# Patient Record
Sex: Female | Born: 1995 | Race: Asian | Hispanic: No | Marital: Single | Smoking: Former smoker
Health system: Southern US, Community
[De-identification: ages and names within clinical notes are randomized; demographics above are authoritative.]

## PROBLEM LIST (undated history)

## (undated) ENCOUNTER — Emergency Department (HOSPITAL_COMMUNITY): Admission: EM | Source: Home / Self Care

## (undated) VITALS — BP 90/60 | HR 102 | Temp 98.6°F | Resp 16 | Ht 60.04 in | Wt 101.6 lb

## (undated) DIAGNOSIS — F32A Depression, unspecified: Secondary | ICD-10-CM

## (undated) DIAGNOSIS — J3501 Chronic tonsillitis: Secondary | ICD-10-CM

## (undated) DIAGNOSIS — F419 Anxiety disorder, unspecified: Secondary | ICD-10-CM

## (undated) DIAGNOSIS — F329 Major depressive disorder, single episode, unspecified: Secondary | ICD-10-CM

## (undated) DIAGNOSIS — J45998 Other asthma: Secondary | ICD-10-CM

## (undated) DIAGNOSIS — F603 Borderline personality disorder: Secondary | ICD-10-CM

---
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
## 2016-01-08 ENCOUNTER — Ambulatory Visit (HOSPITAL_COMMUNITY)
Admission: EM | Admit: 2016-01-08 | Discharge: 2016-01-08 | Disposition: A | Discharge status: Home / Self Care | Payer: Medicaid Other | Attending: Family Medicine | Admitting: Family Medicine

## 2016-01-08 ENCOUNTER — Encounter (HOSPITAL_COMMUNITY): Payer: Self-pay | Admitting: Emergency Medicine

## 2016-01-08 DIAGNOSIS — J020 Streptococcal pharyngitis: Secondary | ICD-10-CM | POA: Diagnosis not present

## 2016-01-08 LAB — POCT RAPID STREP A: STREPTOCOCCUS, GROUP A SCREEN (DIRECT): POSITIVE — AB

## 2016-01-08 MED ORDER — AMOXICILLIN 875 MG PO TABS: 875.0000 mg | ORAL_TABLET | Freq: Two times a day (BID) | ORAL | 0 refills | Status: DC

## 2016-01-08 MED ORDER — LIDOCAINE VISCOUS 2 % MT SOLN: 20.0000 mL | OROMUCOSAL | 0 refills | Status: DC | PRN

## 2016-01-08 NOTE — ED Triage Notes (Signed)
Pt reports having a sore throat today.  She has no other concerning symptoms at this time.
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
## 2016-01-20 MED FILL — ARIPiprazole 5 MG TABS: 5 | 30 days supply | Qty: 60 | Fill #1

## 2016-06-03 ENCOUNTER — Ambulatory Visit (HOSPITAL_COMMUNITY)
Admission: EM | Admit: 2016-06-03 | Discharge: 2016-06-03 | Disposition: A | Discharge status: Home / Self Care | Payer: 59 | Attending: Family Medicine | Admitting: Family Medicine

## 2016-06-03 DIAGNOSIS — J020 Streptococcal pharyngitis: Secondary | ICD-10-CM | POA: Diagnosis not present

## 2016-06-03 LAB — POCT RAPID STREP A: STREPTOCOCCUS, GROUP A SCREEN (DIRECT): POSITIVE — AB

## 2016-06-03 MED ORDER — AZITHROMYCIN 250 MG PO TABS: 250.0000 mg | ORAL_TABLET | Freq: Every day | ORAL | 0 refills | Status: DC

## 2016-06-03 MED ORDER — LIDOCAINE VISCOUS 2 % MT SOLN: 20.0000 mL | OROMUCOSAL | 0 refills | Status: DC | PRN

## 2016-06-03 MED FILL — AZITHROMYCIN 250 MG TABLET: 250 | 5 days supply | Qty: 6 | Fill #0

## 2016-06-03 MED FILL — LIDOCAINE 2% VISCOUS SOLN: 2 | 2 days supply | Qty: 100 | Fill #0
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
## 2016-06-03 NOTE — ED Triage Notes (Signed)
Pt has been suffering from a sore throat for 2 days.  Pt denies any other symptoms.  She is not sure if she has had a fever.

## 2016-06-18 ENCOUNTER — Ambulatory Visit (INDEPENDENT_AMBULATORY_CARE_PROVIDER_SITE_OTHER): Payer: 59 | Admitting: Family Medicine

## 2016-06-18 VITALS — BP 100/70 | HR 70 | Temp 98.1°F | Wt 110.8 lb

## 2016-06-18 DIAGNOSIS — S91301A Unspecified open wound, right foot, initial encounter: Secondary | ICD-10-CM

## 2016-06-18 DIAGNOSIS — J0391 Acute recurrent tonsillitis, unspecified: Secondary | ICD-10-CM | POA: Diagnosis not present

## 2016-06-18 NOTE — Patient Instructions (Signed)
Thank you so much for coming to visit today! I suspect your foot should continue to heal, but may take a few weeks. Please return if no improvement over the next 3-4 weeks or if you notice worsening. I have placed a referral to ENT. Please let me know if you do not hear from them soon.  Dr. Caroleen Hammanumley

## 2016-06-20 NOTE — Progress Notes (Signed)
Subjective:     Patient ID: Nancy Barr, female   DOB: 10/12/1995, 21 y.o.   MRN: [id number]  HPI  Nancy Barr is a 21yo female presenting today for cut on right heel. Reports she stepped on a razor while dancing in the shower on January 19 and cut her right heel. Wound was initially gaping and bled significantly. Did not go to ED for sutures. Wound has slowly been healing, but is not quite heeled yet and still slightly tender, so she came for evaluation. Denies redness or warmth. Able to bear weight. Still keeping area clean and covering.  Also of note, reports recurrent history of tonsillitis. Notes four episodes in last year with most recent episode in January 2018. Has considered surgery and would like referral to ENT to initiate this discussion. Reports she always tests positive for Strep during episodes. Nonsmoker  Review of Systems Per HPI    Objective:   Physical Exam  Constitutional: She appears well-developed and well-nourished. No distress.  HENT:  Head: Normocephalic and atraumatic.  Mouth/Throat: No oropharyngeal exudate.  Cardiovascular: Normal rate and regular rhythm.   No murmur heard. Pulmonary/Chest: Effort normal. No respiratory distress. She has no wheezes.  Skin:  Healing wound on right heel, currently closed with dry skin overtop of healed lesion. Ecchymosis noted around lesion. No signs of redness or warmth       Assessment and Plan:     1. Unspecified open wound, right foot, initial encounter Healing well. Discussed that wound should continue to heel. If no improvement over the next 3-4 weeks or if she notices worsening, to return.  2. Recurrent tonsillitis Referral to ENT to discuss if surgery is appropriate.

## 2016-06-23 ENCOUNTER — Telehealth: Payer: Self-pay | Admitting: General Practice

## 2016-06-23 NOTE — Telephone Encounter (Signed)
Patient will need to be seen in clinic for retesting and if antibiotics are needed at that time. Patient was not seen in this clinic when diagnosis with strep.  Patient was last seen in a couple of weeks ago at an Urgent Care.  Clovis PuMartin, Tamika L, RN

## 2016-06-23 NOTE — Telephone Encounter (Signed)
Mom stated pt had strep a few weeks ago and now has it again. [facility] Mom declined appt for tomorrow. ep

## 2016-06-23 NOTE — Telephone Encounter (Signed)
Mom was advised. Taking pt to Urgent Care. ep

## 2016-07-21 DIAGNOSIS — J3501 Chronic tonsillitis: Secondary | ICD-10-CM | POA: Diagnosis not present

## 2016-07-29 ENCOUNTER — Ambulatory Visit (INDEPENDENT_AMBULATORY_CARE_PROVIDER_SITE_OTHER): Payer: 59 | Admitting: Psychiatry

## 2016-07-29 ENCOUNTER — Encounter (HOSPITAL_COMMUNITY): Payer: Self-pay | Admitting: Psychiatry

## 2016-07-29 VITALS — BP 98/60 | HR 98 | Ht 60.0 in | Wt 109.0 lb

## 2016-07-29 DIAGNOSIS — F39 Unspecified mood [affective] disorder: Secondary | ICD-10-CM | POA: Diagnosis not present

## 2016-07-29 DIAGNOSIS — F902 Attention-deficit hyperactivity disorder, combined type: Secondary | ICD-10-CM

## 2016-07-29 DIAGNOSIS — F411 Generalized anxiety disorder: Secondary | ICD-10-CM

## 2016-07-29 DIAGNOSIS — Z882 Allergy status to sulfonamides status: Secondary | ICD-10-CM

## 2016-07-29 DIAGNOSIS — Z91040 Latex allergy status: Secondary | ICD-10-CM

## 2016-07-29 DIAGNOSIS — F1290 Cannabis use, unspecified, uncomplicated: Secondary | ICD-10-CM | POA: Diagnosis not present

## 2016-07-29 DIAGNOSIS — Z813 Family history of other psychoactive substance abuse and dependence: Secondary | ICD-10-CM

## 2016-07-29 DIAGNOSIS — F17210 Nicotine dependence, cigarettes, uncomplicated: Secondary | ICD-10-CM

## 2016-07-29 NOTE — Progress Notes (Signed)
BH MD/PA/NP OP Progress Note  07/29/2016 4:48 PM SHILEE BIGGS  MRN:  [id number]  Chief Complaint:  Chief Complaint    Follow-up; Mood Disorder; ADHD; Anxiety     Subjective:  I'm doing okay, I'm not been taking my medication for months, I don't need them HPI: Patient is a 21 year old female diagnosed with mood disorder, generalized anxiety disorder, ADHD combined type and borderline personality disorder who presents for follow-up appointment  Patient states that she does not believe in taking psychotropic medication, as that she feels she does not need it. Patient reports that she's not taken any medications for 6-7 months now, adds that she is doing well, is living in an apartment near [facility]. Mom however disagrees and reports that patient continues to have affective instability, gets into arguments, struggles with anger, gets anxious at times along with being overwhelmed. Patient states that she disagrees, does not feel she needs to see a therapist or psychiatrist.  Discussed various options with patient including getting a second opinion from an adult psychiatrist and patient states that she will think about this. Also discussed patient seeing Victorino Dike therapy here at the clinic and patient adds that she's not sure about this.  Patient denies any symptoms of mania, depression, anxiety but does acknowledge mood irritability, poor frustration tolerance, impulse control issues but adds that she's not abusing substances, has friends, is starting orientation for a new job. Mom reports that patient is unable to keep a job because of her mood disorder and patient states that she plans to keep her new job. Patient denies any symptoms of psychosis, any thoughts of hurting herself or others. She also denies any self mutilating behaviors Visit Diagnosis:    ICD-9-CM ICD-10-CM   1. Attention deficit hyperactivity disorder (ADHD), combined type 314.01 F90.2   2. Episodic mood disorder (HCC) 296.90 F39    3. GAD (generalized anxiety disorder) 300.02 F41.1     Past Psychiatric History: Patient has an extensive psychiatric history, was hospitalized twice as an adolescent and then once as an adult. Patient has had multiple visits to the ER, has had episodic mood disorder along with aggression, generalized anxiety disorder and ADHD combined type. Patient has been on psychotropic medications for many years now but has been noncompliant with medications since her last visit.  Past Medical History:  Past Medical History:  Diagnosis Date  . Anxiety   . Asthma   . Borderline personality disorder   . Depression   . Kidney infection     Past Surgical History:  Procedure Laterality Date  . broken arm     2nd grade    Family Psychiatric History: Patient is adopted  Family History:  Family History  Problem Relation Age of Onset  . Drug abuse Brother     Social History:  Social History   Social History  . Marital status: Single    Spouse name: N/A  . Number of children: N/A  . Years of education: N/A   Social History Main Topics  . Smoking status: Current Every Day Smoker    Years: 2.00    Types: E-cigarettes    Start date: 04/09/2013  . Smokeless tobacco: Never Used     Comment: 2nd hand smoke from  brother use of tobacco; says smoke monthly/weekly  . Alcohol use 10.8 oz/week    8 Cans of beer, 10 Shots of liquor per week     Comment: Binges on weekends  . Drug use: Yes  Types: Marijuana     Comment: 1-2 times a month  . Sexual activity: No     Comment: On Nuvaring   Other Topics Concern  . None   Social History Narrative  . None    Allergies:  Allergies  Allergen Reactions  . Latex Rash  . Sulfa Antibiotics Rash    Metabolic Disorder Labs: No results found for: HGBA1C, MPG Lab Results  Component Value Date   PROLACTIN 16.0 01/17/2013   No results found for: CHOL, TRIG, HDL, CHOLHDL, VLDL, LDLCALC   Current Medications: No current outpatient  prescriptions on file.   No current facility-administered medications for this visit.     Neurologic: Headache: No Seizure: No Paresthesias: No  Musculoskeletal: Strength & Muscle Tone: within normal limits Gait & Station: normal Patient leans: N/A  Psychiatric Specialty Exam: Review of Systems  Constitutional: Negative.  Negative for fever and malaise/fatigue.  HENT: Negative.  Negative for congestion and sore throat.   Eyes: Negative.  Negative for blurred vision, discharge and redness.  Respiratory: Negative.  Negative for cough, shortness of breath and wheezing.   Cardiovascular: Negative.  Negative for chest pain.  Gastrointestinal: Negative.  Negative for abdominal pain, heartburn, nausea and vomiting.  Genitourinary: Negative.  Negative for dysuria and urgency.  Musculoskeletal: Negative.  Negative for falls and myalgias.  Skin: Negative.  Negative for itching and rash.  Neurological: Negative.  Negative for dizziness, seizures, loss of consciousness and weakness.  Endo/Heme/Allergies: Negative.  Negative for environmental allergies.  Psychiatric/Behavioral: Negative for depression, hallucinations, substance abuse and suicidal ideas. The patient is not nervous/anxious and does not have insomnia.        Positive for mood irritability    Blood pressure 98/60, pulse 98, height 5' (1.524 m), weight 109 lb (49.4 kg), SpO2 99 %.Body mass index is 21.29 kg/m.  General Appearance: Casual  Eye Contact:  Fair  Speech:  Clear and Coherent and Normal Rate  Volume:  Increased  Mood:  Angry and Irritable  Affect:  Congruent and Labile  Thought Process:  Coherent, Goal Directed and Descriptions of Associations: Intact  Orientation:  Full (Time, Place, and Person)  Thought Content: Logical   Suicidal Thoughts:  No  Homicidal Thoughts:  No  Memory:  Immediate;   Fair Recent;   Fair Remote;   Fair  Judgement:  Impaired  Insight:  Lacking  Psychomotor Activity:  Normal   Concentration:  Concentration: Fair and Attention Span: Fair  Recall:  [employer] of Knowledge: Fair  Language: Fair  Akathisia:  No  Handed:  Right  AIMS (if indicated):  N/A  Assets:  Housing Physical Health Social Support  ADL's:  Intact  Cognition: WNL  Sleep:  good     Treatment Plan Summary:Medication management  Patient refuses to take any medications, states that she can manage without medications but continues to have affective instability and would benefit from a mood stabilizer. Discussed in length with patient the need to see a therapist, work on her coping skills, her communication skills and her relationship issues. Also discussed the need to improve communication with parents, work on looking for a job and working regularly. Patient states that she's gotten a job at [employer] and will start the orientation on Monday. Mom reports that patient has had multiple jobs in the past, just walks off and continues to struggle with her mood, impulse control and anxiety Also discussed seeing an adult psychiatrist to help get a second opinion as patient continues  to struggle with his psychiatric diagnosis, her need for medications in order to be stable   Nelly RoutKUMAR,Jakiah Bienaime, MD 07/29/2016, 4:48 PM

## 2016-09-11 ENCOUNTER — Encounter (HOSPITAL_COMMUNITY): Payer: Self-pay | Admitting: Emergency Medicine

## 2016-09-11 ENCOUNTER — Ambulatory Visit (HOSPITAL_COMMUNITY)
Admission: EM | Admit: 2016-09-11 | Discharge: 2016-09-11 | Disposition: A | Discharge status: Home / Self Care | Payer: 59 | Attending: Internal Medicine | Admitting: Internal Medicine

## 2016-09-11 DIAGNOSIS — F603 Borderline personality disorder: Secondary | ICD-10-CM | POA: Insufficient documentation

## 2016-09-11 DIAGNOSIS — F419 Anxiety disorder, unspecified: Secondary | ICD-10-CM | POA: Diagnosis not present

## 2016-09-11 DIAGNOSIS — J45909 Unspecified asthma, uncomplicated: Secondary | ICD-10-CM | POA: Diagnosis not present

## 2016-09-11 DIAGNOSIS — F329 Major depressive disorder, single episode, unspecified: Secondary | ICD-10-CM | POA: Insufficient documentation

## 2016-09-11 DIAGNOSIS — Z888 Allergy status to other drugs, medicaments and biological substances status: Secondary | ICD-10-CM | POA: Diagnosis not present

## 2016-09-11 DIAGNOSIS — F17210 Nicotine dependence, cigarettes, uncomplicated: Secondary | ICD-10-CM | POA: Insufficient documentation

## 2016-09-11 DIAGNOSIS — R51 Headache: Secondary | ICD-10-CM | POA: Diagnosis not present

## 2016-09-11 DIAGNOSIS — J029 Acute pharyngitis, unspecified: Secondary | ICD-10-CM | POA: Diagnosis not present

## 2016-09-11 DIAGNOSIS — Z9889 Other specified postprocedural states: Secondary | ICD-10-CM | POA: Insufficient documentation

## 2016-09-11 DIAGNOSIS — J3089 Other allergic rhinitis: Secondary | ICD-10-CM | POA: Diagnosis not present

## 2016-09-11 DIAGNOSIS — Z8781 Personal history of (healed) traumatic fracture: Secondary | ICD-10-CM | POA: Diagnosis not present

## 2016-09-11 LAB — POCT RAPID STREP A: Streptococcus, Group A Screen (Direct): NEGATIVE

## 2016-09-11 MED ORDER — DEXAMETHASONE SODIUM PHOSPHATE 10 MG/ML IJ SOLN
INTRAMUSCULAR | Status: AC
  Filled 2016-09-11: qty 1

## 2016-09-11 MED ORDER — DEXAMETHASONE SODIUM PHOSPHATE 10 MG/ML IJ SOLN: 10.0000 mg | Freq: Once | INTRAMUSCULAR | Status: DC

## 2016-09-11 NOTE — ED Provider Notes (Signed)
CSN: [id number]     Arrival date & time 09/11/16  1408 History   First MD Initiated Contact with Patient 09/11/16 1525     Chief Complaint  Patient presents with  . Headache   (Consider location/radiation/quality/duration/timing/severity/associated sxs/prior Treatment) 21 y.o. female presents with headache to parietal lobes X 3-4 days and sore throat. Condition is acute in nature. Condition is made better by nothing. Condition is worse at nihjt. Patient denies any treatment prior to there arrival at this facility. Patient has a history of recurrent tonsillitis and states that she is suppose to get her tonsils removed but has not scheduled it yet due to her work schedule.       Past Medical History:  Diagnosis Date  . Anxiety   . Asthma   . Borderline personality disorder   . Depression   . Kidney infection    Past Surgical History:  Procedure Laterality Date  . broken arm     2nd grade   Family History  Problem Relation Age of Onset  . Drug abuse Brother    Social History  Substance Use Topics  . Smoking status: Current Every Day Smoker    Years: 2.00    Types: E-cigarettes    Start date: 04/09/2013  . Smokeless tobacco: Never Used     Comment: 2nd hand smoke from  brother use of tobacco; says smoke monthly/weekly  . Alcohol use 10.8 oz/week    8 Cans of beer, 10 Shots of liquor per week     Comment: Binges on weekends   OB History    No data available     Review of Systems  Allergies  Latex and Sulfa antibiotics  Home Medications   Prior to Admission medications   Not on File   Meds Ordered and Administered this Visit   Medications  dexamethasone (DECADRON) injection 10 mg (not administered)    Patient refused medication   BP 105/66 (BP Location: Right Arm)   Pulse (!) 101   Temp 98.4 F (36.9 C) (Oral)   Resp 20   LMP 09/04/2016   SpO2 99%  No data found.   Physical Exam  Urgent Care Course     Procedures (including critical care  time)  Labs Review Labs Reviewed - No data to display  Imaging Review No results found.        MDM  No diagnosis found.     Alene Miresmohundro, Angeliz Settlemyre C, NP 09/11/16 [phone]

## 2016-09-11 NOTE — Discharge Instructions (Signed)
Follow up with ENT regarding recurrent tonsillitis. Continue to push fluids and take over the counter medications as directed on the back of the box for symptomatic relief. Flonase may help daily symptoms

## 2016-09-11 NOTE — ED Triage Notes (Signed)
Here for constant HA onset "45 hours" associated w/swollen tonsils, PND, chills  Denies fevers A&O x4... NAD

## 2016-09-14 LAB — CULTURE, GROUP A STREP (THRC)

## 2016-10-08 ENCOUNTER — Encounter (HOSPITAL_BASED_OUTPATIENT_CLINIC_OR_DEPARTMENT_OTHER): Payer: Self-pay | Admitting: *Deleted

## 2016-10-08 DIAGNOSIS — J3501 Chronic tonsillitis: Secondary | ICD-10-CM

## 2016-10-08 HISTORY — DX: Chronic tonsillitis: J35.01

## 2016-10-11 DIAGNOSIS — Z114 Encounter for screening for human immunodeficiency virus [HIV]: Secondary | ICD-10-CM | POA: Diagnosis not present

## 2016-10-11 DIAGNOSIS — Z113 Encounter for screening for infections with a predominantly sexual mode of transmission: Secondary | ICD-10-CM | POA: Diagnosis not present

## 2016-10-11 DIAGNOSIS — Z118 Encounter for screening for other infectious and parasitic diseases: Secondary | ICD-10-CM | POA: Diagnosis not present

## 2016-10-11 DIAGNOSIS — Z1159 Encounter for screening for other viral diseases: Secondary | ICD-10-CM | POA: Diagnosis not present

## 2016-10-12 ENCOUNTER — Other Ambulatory Visit: Payer: Self-pay | Admitting: Otolaryngology

## 2016-10-14 MED FILL — AZITHROMYCIN 500 MG TABLET: 500 | 1 days supply | Qty: 2 | Fill #0

## 2016-10-15 ENCOUNTER — Ambulatory Visit (HOSPITAL_BASED_OUTPATIENT_CLINIC_OR_DEPARTMENT_OTHER)
Admission: RE | Admit: 2016-10-15 | Discharge: 2016-10-15 | Disposition: A | Discharge status: Home / Self Care | Payer: 59 | Source: Ambulatory Visit | Attending: Otolaryngology | Admitting: Otolaryngology

## 2016-10-15 ENCOUNTER — Encounter (HOSPITAL_BASED_OUTPATIENT_CLINIC_OR_DEPARTMENT_OTHER)
Admission: RE | Disposition: A | Discharge status: Home / Self Care | Payer: Self-pay | Source: Ambulatory Visit | Attending: Otolaryngology

## 2016-10-15 HISTORY — DX: Other asthma: J45.998

## 2016-10-15 HISTORY — DX: Chronic tonsillitis: J35.01

## 2016-10-15 SURGERY — TONSILLECTOMY
Anesthesia: General | Laterality: Bilateral

## 2016-10-21 ENCOUNTER — Ambulatory Visit (INDEPENDENT_AMBULATORY_CARE_PROVIDER_SITE_OTHER): Payer: 59 | Admitting: Psychiatry

## 2016-10-21 DIAGNOSIS — Z882 Allergy status to sulfonamides status: Secondary | ICD-10-CM | POA: Diagnosis not present

## 2016-10-21 DIAGNOSIS — F17290 Nicotine dependence, other tobacco product, uncomplicated: Secondary | ICD-10-CM

## 2016-10-21 DIAGNOSIS — F603 Borderline personality disorder: Secondary | ICD-10-CM

## 2016-10-21 DIAGNOSIS — Z91040 Latex allergy status: Secondary | ICD-10-CM

## 2016-10-21 DIAGNOSIS — Z915 Personal history of self-harm: Secondary | ICD-10-CM | POA: Diagnosis not present

## 2016-10-21 DIAGNOSIS — F331 Major depressive disorder, recurrent, moderate: Secondary | ICD-10-CM | POA: Diagnosis not present

## 2016-10-21 DIAGNOSIS — F1310 Sedative, hypnotic or anxiolytic abuse, uncomplicated: Secondary | ICD-10-CM | POA: Diagnosis not present

## 2016-10-21 DIAGNOSIS — F1390 Sedative, hypnotic, or anxiolytic use, unspecified, uncomplicated: Secondary | ICD-10-CM

## 2016-10-21 DIAGNOSIS — F39 Unspecified mood [affective] disorder: Secondary | ICD-10-CM | POA: Diagnosis not present

## 2016-10-21 NOTE — Patient Instructions (Signed)
Escitalopram tablets What is this medicine? ESCITALOPRAM (es sye TAL oh pram) is used to treat depression and certain types of anxiety. This medicine may be used for other purposes; ask your health care provider or pharmacist if you have questions. COMMON BRAND NAME(S): Lexapro What should I tell my health care provider before I take this medicine? They need to know if you have any of these conditions: -bipolar disorder or a family history of bipolar disorder -diabetes -glaucoma -heart disease -kidney or liver disease -receiving electroconvulsive therapy -seizures (convulsions) -suicidal thoughts, plans, or attempt by you or a family member -an unusual or allergic reaction to escitalopram, the related drug citalopram, other medicines, foods, dyes, or preservatives -pregnant or trying to become pregnant -breast-feeding How should I use this medicine? Take this medicine by mouth with a glass of water. Follow the directions on the prescription label. You can take it with or without food. If it upsets your stomach, take it with food. Take your medicine at regular intervals. Do not take it more often than directed. Do not stop taking this medicine suddenly except upon the advice of your doctor. Stopping this medicine too quickly may cause serious side effects or your condition may worsen. A special MedGuide will be given to you by the pharmacist with each prescription and refill. Be sure to read this information carefully each time. Talk to your pediatrician regarding the use of this medicine in children. Special care may be needed. Overdosage: If you think you have taken too much of this medicine contact a poison control center or emergency room at once. NOTE: This medicine is only for you. Do not share this medicine with others. What if I miss a dose? If you miss a dose, take it as soon as you can. If it is almost time for your next dose, take only that dose. Do not take double or extra  doses. What may interact with this medicine? Do not take this medicine with any of the following medications: -certain medicines for fungal infections like fluconazole, itraconazole, ketoconazole, posaconazole, voriconazole -cisapride -citalopram -dofetilide -dronedarone -linezolid -MAOIs like Carbex, Eldepryl, Marplan, Nardil, and Parnate -methylene blue (injected into a vein) -pimozide -thioridazine -ziprasidone This medicine may also interact with the following medications: -alcohol -amphetamines -aspirin and aspirin-like medicines -carbamazepine -certain medicines for depression, anxiety, or psychotic disturbances -certain medicines for migraine headache like almotriptan, eletriptan, frovatriptan, naratriptan, rizatriptan, sumatriptan, zolmitriptan -certain medicines for sleep -certain medicines that treat or prevent blood clots like warfarin, enoxaparin, dalteparin -cimetidine -diuretics -fentanyl -furazolidone -isoniazid -lithium -metoprolol -NSAIDs, medicines for pain and inflammation, like ibuprofen or naproxen -other medicines that prolong the QT interval (cause an abnormal heart rhythm) -procarbazine -rasagiline -supplements like St. John's wort, kava kava, valerian -tramadol -tryptophan This list may not describe all possible interactions. Give your health care provider a list of all the medicines, herbs, non-prescription drugs, or dietary supplements you use. Also tell them if you smoke, drink alcohol, or use illegal drugs. Some items may interact with your medicine. What should I watch for while using this medicine? Tell your doctor if your symptoms do not get better or if they get worse. Visit your doctor or health care professional for regular checks on your progress. Because it may take several weeks to see the full effects of this medicine, it is important to continue your treatment as prescribed by your doctor. Patients and their families should watch out for  new or worsening thoughts of suicide or depression. Also watch out for   sudden changes in feelings such as feeling anxious, agitated, panicky, irritable, hostile, aggressive, impulsive, severely restless, overly excited and hyperactive, or not being able to sleep. If this happens, especially at the beginning of treatment or after a change in dose, call your health care professional. Dennis Bast may get drowsy or dizzy. Do not drive, use machinery, or do anything that needs mental alertness until you know how this medicine affects you. Do not stand or sit up quickly, especially if you are an older patient. This reduces the risk of dizzy or fainting spells. Alcohol may interfere with the effect of this medicine. Avoid alcoholic drinks. Your mouth may get dry. Chewing sugarless gum or sucking hard candy, and drinking plenty of water may help. Contact your doctor if the problem does not go away or is severe. What side effects may I notice from receiving this medicine? Side effects that you should report to your doctor or health care professional as soon as possible: -allergic reactions like skin rash, itching or hives, swelling of the face, lips, or tongue -anxious -black, tarry stools -changes in vision -confusion -elevated mood, decreased need for sleep, racing thoughts, impulsive behavior -eye pain -fast, irregular heartbeat -feeling faint or lightheaded, falls -feeling agitated, angry, or irritable -hallucination, loss of contact with reality -loss of balance or coordination -loss of memory -painful or prolonged erections -restlessness, pacing, inability to keep still -seizures -stiff muscles -suicidal thoughts or other mood changes -trouble sleeping -unusual bleeding or bruising -unusually weak or tired -vomiting Side effects that usually do not require medical attention (report to your doctor or health care professional if they continue or are bothersome): -changes in appetite -change in sex  drive or performance -headache -increased sweating -indigestion, nausea -tremors This list may not describe all possible side effects. Call your doctor for medical advice about side effects. You may report side effects to FDA at 1-800-FDA-1088. Where should I keep my medicine? Keep out of reach of children. Store at room temperature between 15 and 30 degrees C (59 and 86 degrees F). Throw away any unused medicine after the expiration date. NOTE: This sheet is a summary. It may not cover all possible information. If you have questions about this medicine, talk to your doctor, pharmacist, or health care provider.  2018 Elsevier/Gold Standard (2015-09-29 13:20:23) Buspirone tablets What is this medicine? BUSPIRONE (byoo SPYE rone) is used to treat anxiety disorders. This medicine may be used for other purposes; ask your health care provider or pharmacist if you have questions. COMMON BRAND NAME(S): BuSpar What should I tell my health care provider before I take this medicine? They need to know if you have any of these conditions: -kidney or liver disease -an unusual or allergic reaction to buspirone, other medicines, foods, dyes, or preservatives -pregnant or trying to get pregnant -breast-feeding How should I use this medicine? Take this medicine by mouth with a glass of water. Follow the directions on the prescription label. You may take this medicine with or without food. To ensure that this medicine always works the same way for you, you should take it either always with or always without food. Take your doses at regular intervals. Do not take your medicine more often than directed. Do not stop taking except on the advice of your doctor or health care professional. Talk to your pediatrician regarding the use of this medicine in children. Special care may be needed. Overdosage: If you think you have taken too much of this medicine contact a poison control  center or emergency room at  once. NOTE: This medicine is only for you. Do not share this medicine with others. What if I miss a dose? If you miss a dose, take it as soon as you can. If it is almost time for your next dose, take only that dose. Do not take double or extra doses. What may interact with this medicine? Do not take this medicine with any of the following medications: -linezolid -MAOIs like Carbex, Eldepryl, Marplan, Nardil, and Parnate -methylene blue -procarbazine This medicine may also interact with the following medications: -diazepam -digoxin -diltiazem -erythromycin -grapefruit juice -haloperidol -medicines for mental depression or mood problems -medicines for seizures like carbamazepine, phenobarbital and phenytoin -nefazodone -other medications for anxiety -rifampin -ritonavir -some antifungal medicines like itraconazole, ketoconazole, and voriconazole -verapamil -warfarin This list may not describe all possible interactions. Give your health care provider a list of all the medicines, herbs, non-prescription drugs, or dietary supplements you use. Also tell them if you smoke, drink alcohol, or use illegal drugs. Some items may interact with your medicine. What should I watch for while using this medicine? Visit your doctor or health care professional for regular checks on your progress. It may take 1 to 2 weeks before your anxiety gets better. You may get drowsy or dizzy. Do not drive, use machinery, or do anything that needs mental alertness until you know how this drug affects you. Do not stand or sit up quickly, especially if you are an older patient. This reduces the risk of dizzy or fainting spells. Alcohol can make you more drowsy and dizzy. Avoid alcoholic drinks. What side effects may I notice from receiving this medicine? Side effects that you should report to your doctor or health care professional as soon as possible: -blurred vision or other vision changes -chest  pain -confusion -difficulty breathing -feelings of hostility or anger -muscle aches and pains -numbness or tingling in hands or feet -ringing in the ears -skin rash and itching -vomiting -weakness Side effects that usually do not require medical attention (report to your doctor or health care professional if they continue or are bothersome): -disturbed dreams, nightmares -headache -nausea -restlessness or nervousness -sore throat and nasal congestion -stomach upset This list may not describe all possible side effects. Call your doctor for medical advice about side effects. You may report side effects to FDA at 1-800-FDA-1088. Where should I keep my medicine? Keep out of the reach of children. Store at room temperature below 30 degrees C (86 degrees F). Protect from light. Keep container tightly closed. Throw away any unused medicine after the expiration date. NOTE: This sheet is a summary. It may not cover all possible information. If you have questions about this medicine, talk to your doctor, pharmacist, or health care provider.  2018 Elsevier/Gold Standard (2009-12-04 18:06:11)
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
## 2016-11-10 ENCOUNTER — Ambulatory Visit (HOSPITAL_COMMUNITY)
Admission: EM | Admit: 2016-11-10 | Discharge: 2016-11-10 | Disposition: A | Discharge status: Home / Self Care | Payer: 59 | Attending: Family Medicine | Admitting: Family Medicine

## 2016-11-10 ENCOUNTER — Ambulatory Visit (INDEPENDENT_AMBULATORY_CARE_PROVIDER_SITE_OTHER): Payer: 59

## 2016-11-10 ENCOUNTER — Encounter (HOSPITAL_COMMUNITY): Payer: Self-pay | Admitting: Emergency Medicine

## 2016-11-10 DIAGNOSIS — M79641 Pain in right hand: Secondary | ICD-10-CM | POA: Diagnosis not present

## 2016-11-10 DIAGNOSIS — S6991XA Unspecified injury of right wrist, hand and finger(s), initial encounter: Secondary | ICD-10-CM | POA: Diagnosis not present

## 2016-11-10 NOTE — ED Triage Notes (Signed)
[facility] with a complaint of right hand pain that started last night after punching a wall.

## 2016-11-10 NOTE — ED Provider Notes (Signed)
  [facility]   [id number] 11/10/16 Arrival Time: 1154  ASSESSMENT & PLAN:  Today you were diagnosed with the following: 1. Hand injury, right, initial encounter    No broken bones were seen on your x-rays today. You may take ibuprofen as needed for the next few days for help with discomfort.  You have not been prescribed prescription medications this visit.   If you are not improving over the next few days or feel you are worsening please follow up here or the Emergency Department if you are unable to see your regular doctor.  Reviewed expectations re: course of current medical issues. Questions answered. Outlined signs and symptoms indicating need for more acute intervention. Patient verbalized understanding. After Visit Summary given.   SUBJECTIVE:  Nancy Barr is a 21 y.o. female who presents with complaint of R hand pain. Intoxicated last evening and "decided to punch a wall". Hand pain since. Noticed bruising today. Mild swelling. No sensation changes. No self-treatment.  ROS: As per HPI.   OBJECTIVE:  Vitals:   11/10/16 1242  BP: 117/68  Pulse: 65  Resp: 16  Temp: 98.5 F (36.9 C)  TempSrc: Oral  SpO2: 100%     General appearance: alert;  no distress Extremities: R hand with bruising over dorsal 4/5 metacarpal distribution; proximal 5th metacarpal tenderness; R wrist with FROM MSK: symmetrical with no gross deformities Skin: warm and dry Neurologic: RUE with intact sensation  Imaging: Dg Hand Complete Right  Result Date: 11/10/2016 CLINICAL DATA:  Per pt: side punched a tile wall last night and injured the right hand. Patient hit the wall with the medial side of the right hand fist. Pain is the right hand, 4th AND 5th metacarpals. No prior injury to the right hand. Patient is not a diabetic EXAM: RIGHT HAND - COMPLETE 3+ VIEW COMPARISON:  None. FINDINGS: There is no evidence of fracture or dislocation. There is no evidence of arthropathy or other  focal bone abnormality. Soft tissues are unremarkable. IMPRESSION: Negative. Electronically Signed   By: Norva PavlovElizabeth  Brown M.D.   On: 11/10/2016 13:43    Allergies  Allergen Reactions  . Latex Rash  . Sulfa Antibiotics Rash    PMHx, SurgHx, SocialHx, Medications, and Allergies were reviewed in the Visit Navigator and updated as appropriate.       Mardella LaymanHagler, Deni Lefever, MD 11/10/16 [phone]

## 2016-11-10 NOTE — Discharge Instructions (Signed)
Today you were diagnosed with the following: 1. Hand injury, right, initial encounter    No broken bones were seen on your x-rays today. You may take ibuprofen as needed for the next few days for help with discomfort.  You have not been prescribed prescription medications this visit.   If you are not improving over the next few days or feel you are worsening please follow up here or the Emergency Department if you are unable to see your regular doctor.

## 2017-01-25 ENCOUNTER — Ambulatory Visit (HOSPITAL_COMMUNITY)
Admission: EM | Admit: 2017-01-25 | Discharge: 2017-01-25 | Disposition: A | Discharge status: Home / Self Care | Payer: BC Managed Care – PPO | Attending: Family Medicine | Admitting: Family Medicine

## 2017-01-25 ENCOUNTER — Encounter (HOSPITAL_COMMUNITY): Payer: Self-pay | Admitting: *Deleted

## 2017-01-25 DIAGNOSIS — R509 Fever, unspecified: Secondary | ICD-10-CM

## 2017-01-25 DIAGNOSIS — R05 Cough: Secondary | ICD-10-CM

## 2017-01-25 DIAGNOSIS — J029 Acute pharyngitis, unspecified: Secondary | ICD-10-CM | POA: Diagnosis not present

## 2017-01-25 NOTE — ED Triage Notes (Signed)
Patient reports sore throat x 3 days. Unsure of fevers.

## 2017-01-25 NOTE — Discharge Instructions (Signed)
It was nice meeting you today Nancy Barr!  For your sore throat, you can try Cepachol throat lozenges or Chloraseptic throat spray. Both of these have a numbing medication in them that will make your throat feel better. You can also take ibuprofen or Tylenol to help with the pain.   Please schedule a follow up appointment with your ENT to discuss having your tonsils removed.   If you have any questions or concerns, please feel free to call the clinic.   Be well,  Dr. Natale Milch

## 2017-01-25 NOTE — ED Provider Notes (Signed)
[facility]    CSN: [id number] Arrival date & time: 01/25/17  1422  History   Chief Complaint Chief Complaint  Patient presents with  . Sore Throat    HPI Nancy Barr is a 21 y.o. female.   HPI   Patient presents with sore throat. Started four days ago. Severity fluctuates throughout the day. Has not tried anything to help with pain. Endorses accompanying cough and HA. Denies congestion. Thinks may have had a fever on the first day of symptoms but none since. Has been eating and drinking normally. Denies nausea, vomiting, diarrhea. Says that she has seen an ENT before and has been told that she needs a tonsillectomy however did not attend follow-up appointments to schedule to the procedure due to work conflicts.    Past Medical History:  Diagnosis Date  . Anxiety   . Borderline personality disorder   . Chronic tonsillitis 10/2016  . Depression   . Seasonal asthma    no current med.    Patient Active Problem List   Diagnosis Date Noted  . Suicide attempt by drug ingestion (HCC)   . Attention deficit hyperactivity disorder (ADHD), combined type, severe 04/23/2014  . MDD (major depressive disorder), recurrent episode, moderate (HCC) 09/08/2011  . Generalized anxiety disorder 03/24/2011    Past Surgical History:  Procedure Laterality Date  . HYMENECTOMY  03/09/2010   with drainage of hematocolpos    OB History    No data available       Home Medications    Prior to Admission medications   Not on File    Family History Family History  Problem Relation Age of Onset  . Adopted: Yes    Social History Social History  Substance Use Topics  . Smoking status: Current Every Day Smoker    Years: 2.00    Types: E-cigarettes    Start date: 04/09/2013  . Smokeless tobacco: Never Used  . Alcohol use 0.0 oz/week     Comment: 1-2 x/week     Allergies   Latex and Sulfa antibiotics   Review of Systems Review of Systems  Constitutional: Positive  for fever (Subjective).  HENT: Positive for sore throat. Negative for congestion and trouble swallowing.   Eyes: Negative for redness and itching.  Respiratory: Positive for cough.   Gastrointestinal: Negative for constipation, diarrhea, nausea and vomiting.    Physical Exam Triage Vital Signs ED Triage Vitals  Enc Vitals Group     BP      Pulse      Resp      Temp      Temp src      SpO2      Weight      Height      Head Circumference      Peak Flow      Pain Score      Pain Loc      Pain Edu?      Excl. in GC?    No data found.   Updated Vital Signs BP 109/65 (BP Location: Left Arm)   Pulse 77   Temp 98.2 F (36.8 C)   Resp 16   SpO2 100%   Physical Exam  Constitutional: She is oriented to person, place, and time. She appears well-developed and well-nourished. No distress.  HENT:  Head: Normocephalic and atraumatic.  Nose: Nose normal.  Moderate tonsillar enlargement bilaterally. Mild oropharyngeal erythema. No exudates. Uvula midline. MMM.   Eyes: Pupils are equal, round, and  reactive to light. Conjunctivae and EOM are normal. Right eye exhibits no discharge. Left eye exhibits no discharge.  Neck: Normal range of motion. Neck supple.  Cardiovascular: Normal rate, regular rhythm and normal heart sounds.   No murmur heard. Pulmonary/Chest: Effort normal and breath sounds normal. No respiratory distress. She has no wheezes.  Abdominal: Soft. Bowel sounds are normal. She exhibits no distension. There is no tenderness.  Lymphadenopathy:    She has cervical adenopathy (Mild).  Neurological: She is alert and oriented to person, place, and time.  Skin: Skin is warm and dry.  Psychiatric: Her behavior is normal.  Flat affect     UC Treatments / Results  Labs (all labs ordered are listed, but only abnormal results are displayed) Labs Reviewed - No data to display  EKG  EKG Interpretation None       Radiology No results found.  Procedures Procedures  (including critical care time)  Medications Ordered in UC Medications - No data to display   Initial Impression / Assessment and Plan / UC Course  I have reviewed the triage vital signs and the nursing notes.  Pertinent labs & imaging results that were available during my care of the patient were reviewed by me and considered in my medical decision making (see chart for details).     Patient presenting with sore throat x4d, likely viral pharyngitis. Centor score 1 (mild cervical lymphadenopathy) making strep pharyngitis less likely. Afebrile, well-appearing on exam. No issues swallowing and well-hydrated on exam. No signs of peritonsillar abscess. Normal respiratory status on RA without stridor or wheezing. Has already been evaluated by ENT for recurrent sore throats and informed that she needs a tonsillectomy. Discussed symptomatic management and encouraged patient to schedule f/u appt with her ENT regarding tonsillectomy.   Final Clinical Impressions(s) / UC Diagnoses   Final diagnoses:  Sore throat    New Prescriptions There are no discharge medications for this patient.   Tarri Abernethy, MD, MPH PGY-3    Marquette Saa, MD 01/25/17 [phone]

## 2017-06-18 ENCOUNTER — Ambulatory Visit (INDEPENDENT_AMBULATORY_CARE_PROVIDER_SITE_OTHER): Payer: BC Managed Care – PPO | Admitting: Psychiatry

## 2017-06-18 ENCOUNTER — Encounter (HOSPITAL_COMMUNITY): Payer: Self-pay | Admitting: Psychiatry

## 2017-06-18 VITALS — BP 110/68 | HR 66 | Ht 60.75 in | Wt 118.4 lb

## 2017-06-18 DIAGNOSIS — Z915 Personal history of self-harm: Secondary | ICD-10-CM

## 2017-06-18 DIAGNOSIS — F1290 Cannabis use, unspecified, uncomplicated: Secondary | ICD-10-CM | POA: Diagnosis not present

## 2017-06-18 DIAGNOSIS — F331 Major depressive disorder, recurrent, moderate: Secondary | ICD-10-CM | POA: Diagnosis not present

## 2017-06-18 DIAGNOSIS — F603 Borderline personality disorder: Secondary | ICD-10-CM | POA: Diagnosis not present

## 2017-06-18 DIAGNOSIS — R454 Irritability and anger: Secondary | ICD-10-CM

## 2017-06-18 DIAGNOSIS — F410 Panic disorder [episodic paroxysmal anxiety] without agoraphobia: Secondary | ICD-10-CM | POA: Diagnosis not present

## 2017-06-18 DIAGNOSIS — R450 Nervousness: Secondary | ICD-10-CM

## 2017-06-18 DIAGNOSIS — F1099 Alcohol use, unspecified with unspecified alcohol-induced disorder: Secondary | ICD-10-CM

## 2017-06-18 DIAGNOSIS — G4700 Insomnia, unspecified: Secondary | ICD-10-CM | POA: Diagnosis not present

## 2017-06-18 DIAGNOSIS — F1310 Sedative, hypnotic or anxiolytic abuse, uncomplicated: Secondary | ICD-10-CM | POA: Diagnosis not present

## 2017-06-18 DIAGNOSIS — F411 Generalized anxiety disorder: Secondary | ICD-10-CM | POA: Diagnosis not present

## 2017-06-18 DIAGNOSIS — F909 Attention-deficit hyperactivity disorder, unspecified type: Secondary | ICD-10-CM

## 2017-06-18 DIAGNOSIS — Z91419 Personal history of unspecified adult abuse: Secondary | ICD-10-CM

## 2017-06-18 DIAGNOSIS — F17290 Nicotine dependence, other tobacco product, uncomplicated: Secondary | ICD-10-CM | POA: Diagnosis not present

## 2017-06-18 DIAGNOSIS — Z560 Unemployment, unspecified: Secondary | ICD-10-CM

## 2017-06-18 DIAGNOSIS — Z630 Problems in relationship with spouse or partner: Secondary | ICD-10-CM

## 2017-06-18 DIAGNOSIS — Z975 Presence of (intrauterine) contraceptive device: Secondary | ICD-10-CM

## 2017-06-18 MED ORDER — ARIPIPRAZOLE 5 MG PO TABS: 5.0000 mg | ORAL_TABLET | Freq: Every day | ORAL | 1 refills | Status: DC

## 2017-06-18 MED ORDER — SERTRALINE HCL 50 MG PO TABS: 50.0000 mg | ORAL_TABLET | Freq: Every day | ORAL | 1 refills | Status: DC
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
## 2017-07-09 ENCOUNTER — Ambulatory Visit (INDEPENDENT_AMBULATORY_CARE_PROVIDER_SITE_OTHER): Payer: BC Managed Care – PPO | Admitting: Psychiatry

## 2017-07-09 ENCOUNTER — Encounter (HOSPITAL_COMMUNITY): Payer: Self-pay | Admitting: Psychiatry

## 2017-07-09 DIAGNOSIS — F603 Borderline personality disorder: Secondary | ICD-10-CM | POA: Diagnosis not present

## 2017-07-09 DIAGNOSIS — F17290 Nicotine dependence, other tobacco product, uncomplicated: Secondary | ICD-10-CM

## 2017-07-09 DIAGNOSIS — F1290 Cannabis use, unspecified, uncomplicated: Secondary | ICD-10-CM

## 2017-07-09 DIAGNOSIS — F39 Unspecified mood [affective] disorder: Secondary | ICD-10-CM | POA: Diagnosis not present

## 2017-07-09 DIAGNOSIS — F411 Generalized anxiety disorder: Secondary | ICD-10-CM | POA: Diagnosis not present

## 2017-07-09 DIAGNOSIS — F331 Major depressive disorder, recurrent, moderate: Secondary | ICD-10-CM | POA: Diagnosis not present

## 2017-07-09 DIAGNOSIS — R45851 Suicidal ideations: Secondary | ICD-10-CM

## 2017-07-09 MED ORDER — CITALOPRAM HYDROBROMIDE 20 MG PO TABS: 20.0000 mg | ORAL_TABLET | Freq: Every day | ORAL | 0 refills | Status: DC

## 2017-07-09 NOTE — Progress Notes (Signed)
BH MD/PA/NP OP Progress Note  07/09/2017 1:04 PM Nancy Barr Cly  MRN:  [id number]  Chief Complaint: med management  HPI: Nancy Barr Philley presents 10 minutes late for visit. Seen 4 weeks ago and started on abilify and zoloft with Dr. Lolly MustacheArfeen.  Presents for follow-up today.  She has not been taking Zoloft and Abilify because Zoloft makes her nauseous.  We agreed to discontinue both medicine and start with a more gentle SSRI, Celexa.  I educated patient on the critical nature of individual therapy and provided [facility].  She is willing to consider this.  I spent time engaging in MI style interview with her to consider how important it is for her whether she address her anxiety.  I reviewed the risks and benefits of Celexa, black box warning associated with SSRI.  We will follow-up in 8 weeks or sooner if needed.  Patient's mother is available in present as well, and echoes her hopes that Kara Meadmma will participate in therapy.  Visit Diagnosis:    ICD-10-CM   1. Borderline personality disorder (HCC) F60.3   2. MDD (major depressive disorder), recurrent episode, moderate (HCC) F33.1 citalopram (CELEXA) 20 MG tablet  3. GAD (generalized anxiety disorder) F41.1 citalopram (CELEXA) 20 MG tablet  4. Episodic mood disorder (HCC) F39     Past Psychiatric History: See intake H&Barr for full details. Reviewed, with no updates at this time.   Past Medical History:  Past Medical History:  Diagnosis Date  . Anxiety   . Borderline personality disorder (HCC)   . Chronic tonsillitis 10/2016  . Depression   . Seasonal asthma    no current med.    Past Surgical History:  Procedure Laterality Date  . HYMENECTOMY  03/09/2010   with drainage of hematocolpos    Family Psychiatric History: See intake H&Barr for full details. Reviewed, with no updates at this time.   Family History:  Family History  Adopted: Yes    Social History:  Social History   Socioeconomic  History  . Marital status: Single    Spouse name: Not on file  . Number of children: Not on file  . Years of education: Not on file  . Highest education level: Not on file  Social Needs  . Financial resource strain: Not on file  . Food insecurity - worry: Not on file  . Food insecurity - inability: Not on file  . Transportation needs - medical: Not on file  . Transportation needs - non-medical: Not on file  Occupational History  . Not on file  Tobacco Use  . Smoking status: Current Every Day Smoker    Years: 2.00    Types: E-cigarettes    Start date: 04/09/2013  . Smokeless tobacco: Never Used  Substance and Sexual Activity  . Alcohol use: Yes    Alcohol/week: 0.0 oz    Comment: 1-2 x/week  . Drug use: Yes    Types: Marijuana    Comment: last used 10/07/2016  . Sexual activity: Yes    Birth control/protection: Implant, Condom    Comment: On Nuvaring  Other Topics Concern  . Not on file  Social History Narrative  . Not on file    Allergies:  Allergies  Allergen Reactions  . Latex Rash  . Sulfa Antibiotics Rash    Metabolic Disorder Labs: No results found for: HGBA1C, MPG Lab Results  Component Value Date   PROLACTIN 16.0 01/17/2013   No results found for: CHOL,  TRIG, HDL, CHOLHDL, VLDL, LDLCALC Lab Results  Component Value Date   TSH 2.559 01/17/2013    Therapeutic Level Labs: No results found for: LITHIUM No results found for: VALPROATE No components found for:  CBMZ  Current Medications: Current Outpatient Medications  Medication Sig Dispense Refill  . citalopram (CELEXA) 20 MG tablet Take 1 tablet (20 mg total) by mouth daily. Take 1/2 tablet daily for 1 week then increase to 1 tablet 90 tablet 0   No current facility-administered medications for this visit.     Musculoskeletal: Strength & Muscle Tone: within normal limits Gait & Station: normal Patient leans: N/A  Psychiatric Specialty Exam: ROS  There were no vitals taken for this  visit.There is no height or weight on file to calculate BMI.  General Appearance: Casual and Fairly Groomed  Eye Contact:  Fair  Speech:  Clear and Coherent and Normal Rate  Volume:  Decreased  Mood:  Dysphoric and Irritable  Affect:  Congruent  Thought Process:  Coherent, Goal Directed and Descriptions of Associations: Intact  Orientation:  Full (Time, Place, and Person)  Thought Content: Logical   Suicidal Thoughts:  Yes.  without intent/plan  Homicidal Thoughts:  No  Memory:  Immediate;   Fair  Judgement:  Fair  Insight:  Fair  Psychomotor Activity:  Normal  Concentration:  Attention Span: Fair  Recall:  [employer] of Knowledge: Good  Language: Good  Akathisia:  Negative  Handed:  Right  AIMS (if indicated): not done  Assets:  Therapist, sports  ADL's:  Intact  Cognition: WNL  Sleep:  Fair   Screenings: AIMS     [facility]  AIMS Total Score  0    AUDIT     Ad[facility]) [facility] Admission (Discharged) from [facility]  Alcohol Use Disorder Identification Test Final Score (AUDIT)  0  1    MDI     Counselor from [facility]2011 [facility]  Total Score (max 50)  45       Assessment and Plan: Nancy Life presents for medication management follow-up after recently being started on Abilify and Zoloft by Dr. Lolly Mustache.  She has not been adherent to the medication regimen and reports that she is only taken a few doses because the medicine makes her nauseous.  We agreed to initiate Celexa and discontinue Abilify and Zoloft.  She does not present with any acute suicidality or self-injurious behaviors, and is willing to consider individual therapy.  We will follow-up in 2 months or sooner if  needed.  1. Borderline personality disorder (HCC)   2. MDD (major depressive disorder), recurrent episode, moderate (HCC)   3. GAD (generalized anxiety disorder)   4. Episodic mood disorder (HCC)     Status of current problems: unchanged  Labs Ordered: No orders of the defined types were placed in this encounter.   Labs Reviewed: N/A  Collateral Obtained/Records Reviewed: nccsd reviewed  Plan:  Abilify and Zoloft discontinued Celexa 10 mg daily for 1 week, then increase to 20 mg daily Referral to [facility] psychology clinic for DBT  I spent 15 minutes with the patient in direct face-to-face clinical care.  Greater than 50% of this time was spent in counseling and coordination of care with the patient.    Burnard Leigh, MD  07/09/2017, 1:04 PM

## 2017-09-05 ENCOUNTER — Ambulatory Visit (HOSPITAL_COMMUNITY): Payer: BC Managed Care – PPO | Admitting: Psychiatry

## 2017-09-30 ENCOUNTER — Other Ambulatory Visit (HOSPITAL_COMMUNITY): Payer: Self-pay

## 2017-09-30 ENCOUNTER — Telehealth (HOSPITAL_COMMUNITY): Payer: Self-pay

## 2017-09-30 DIAGNOSIS — F331 Major depressive disorder, recurrent, moderate: Secondary | ICD-10-CM

## 2017-09-30 DIAGNOSIS — F411 Generalized anxiety disorder: Secondary | ICD-10-CM

## 2017-09-30 MED ORDER — CITALOPRAM HYDROBROMIDE 20 MG PO TABS: 20.0000 mg | ORAL_TABLET | Freq: Every day | ORAL | 0 refills | Status: DC

## 2017-09-30 NOTE — Telephone Encounter (Signed)
Sent in 30 day refill to [facility] [address].

## 2017-09-30 NOTE — Telephone Encounter (Signed)
Received a refill fax request for Citalopram HBR . Next appointment is scheduled for 10-20-17. Pharmacy is [facility]. Please advise

## 2017-09-30 NOTE — Telephone Encounter (Signed)
That is fine to send refill of 30 days.  Thank you!

## 2017-10-08 IMAGING — DX DG HAND COMPLETE 3+V*R*
3 series · 3 of 3 positions shown · non-contrast
Comparison: None.

CLINICAL DATA: Per pt: side punched a tile wall last night and
injured the right hand. Patient hit the wall with the medial side of
the right hand fist. Pain is the right hand, 4th AND 5th
metacarpals. No prior injury to the right hand. Patient is not a
diabetic

EXAM:
RIGHT HAND - COMPLETE 3+ VIEW

[hand pa]
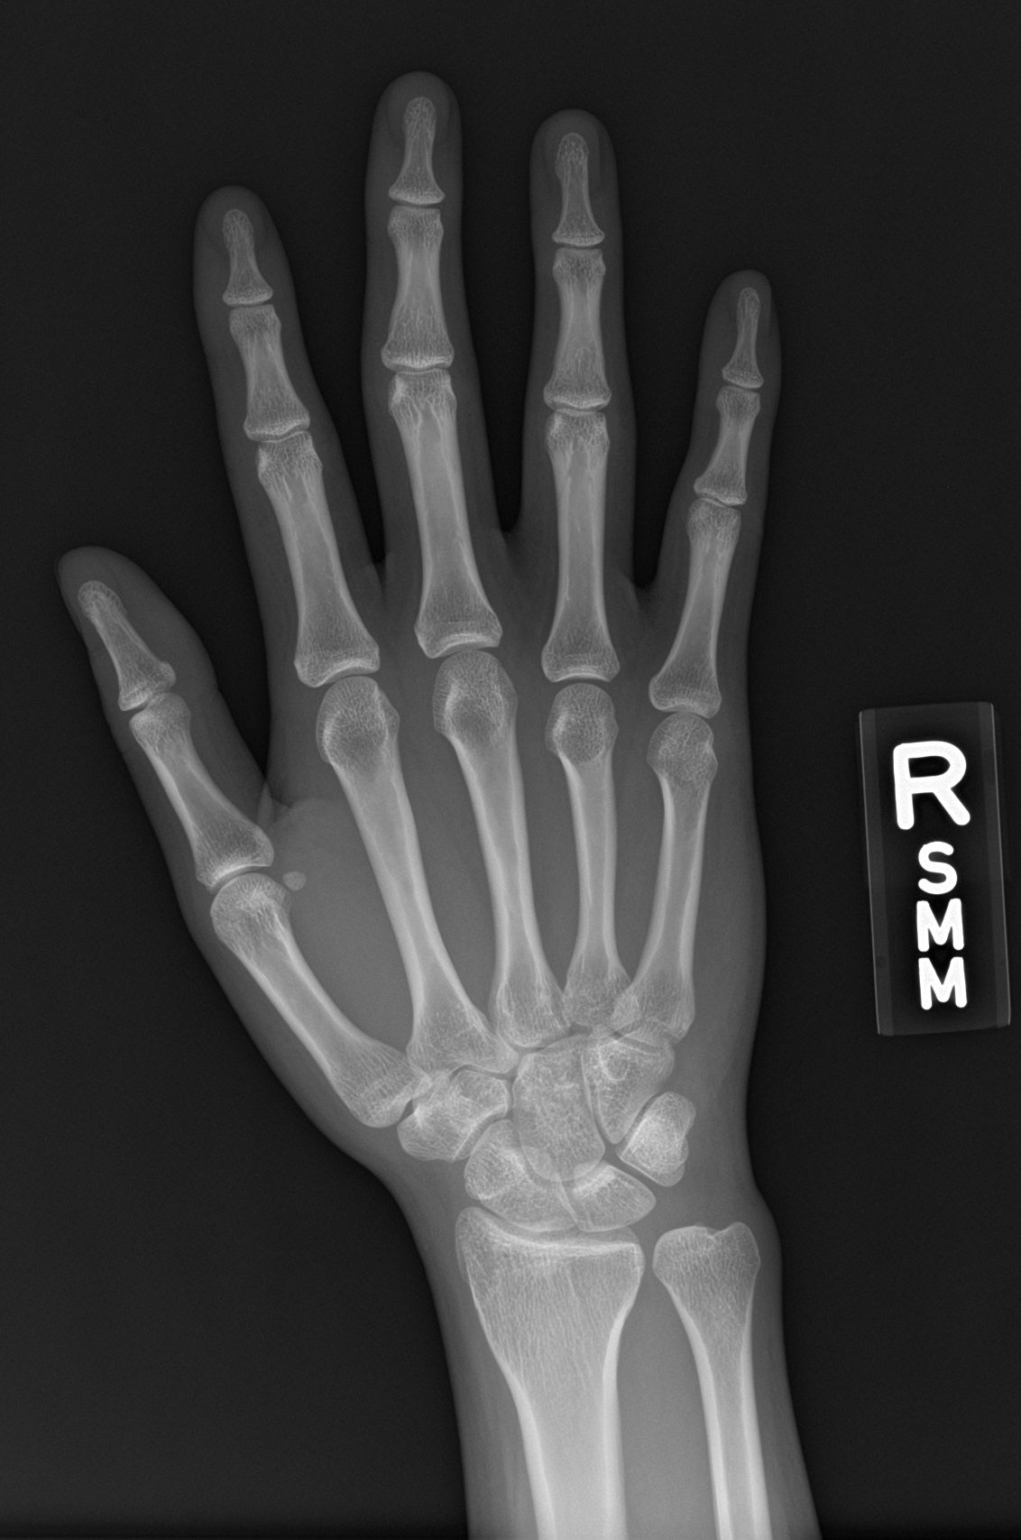

[hand obl]
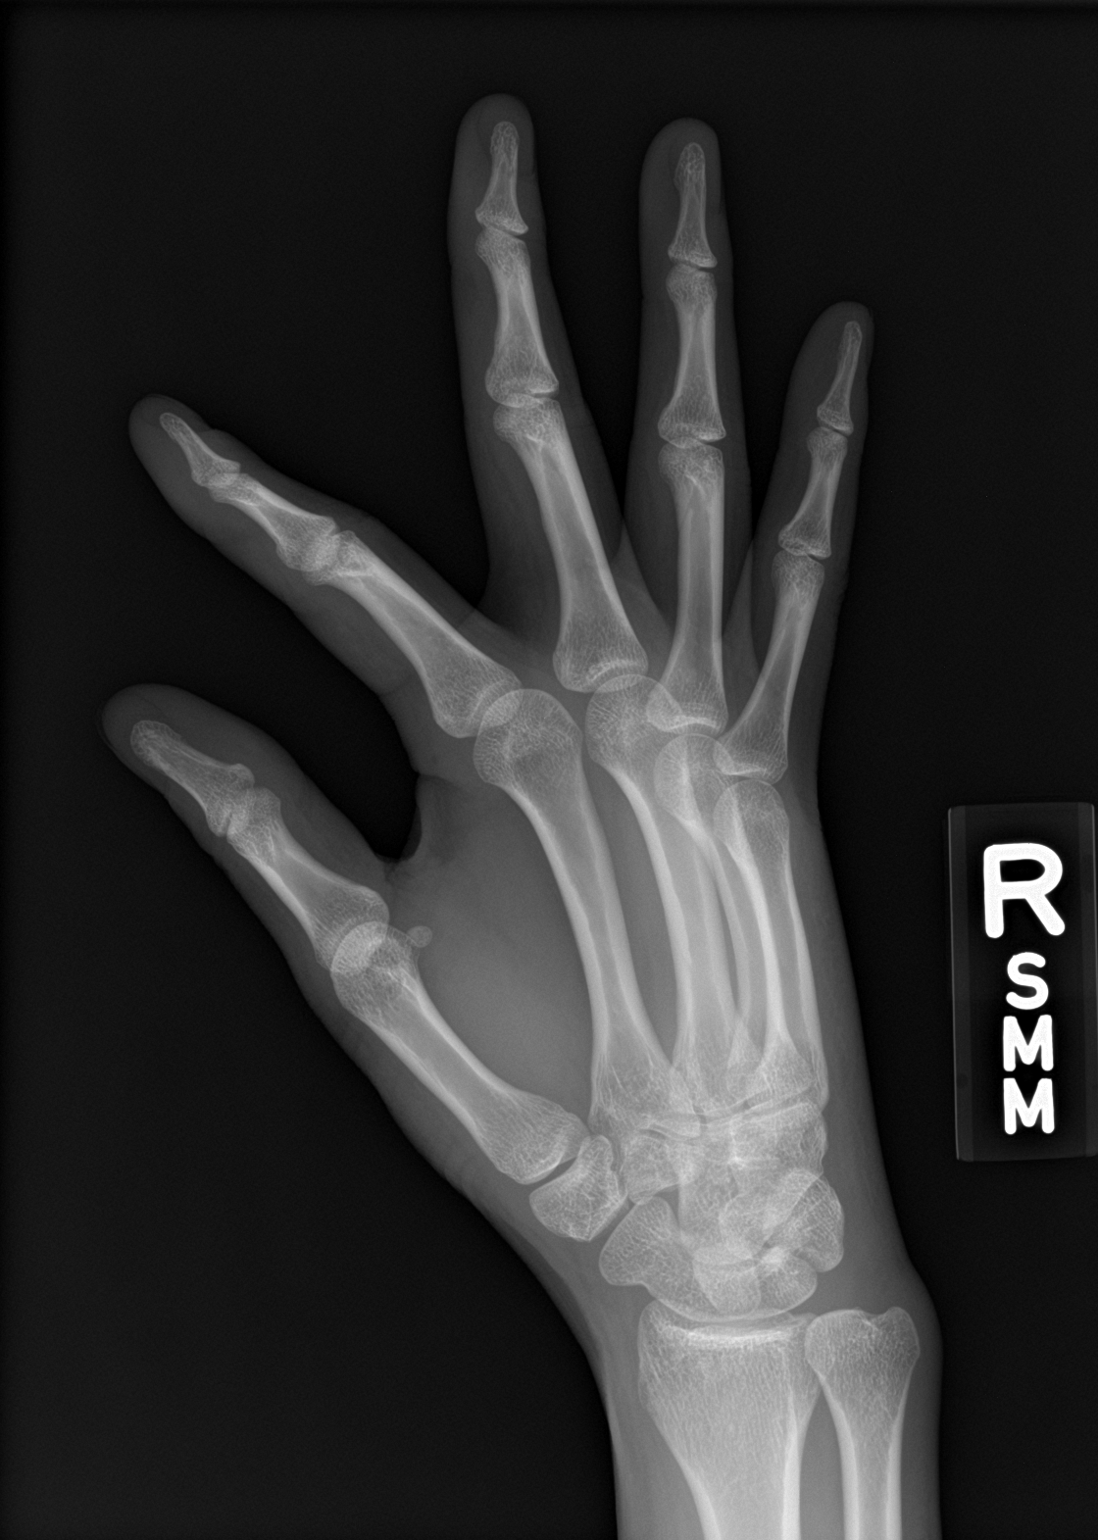

[hand lat]
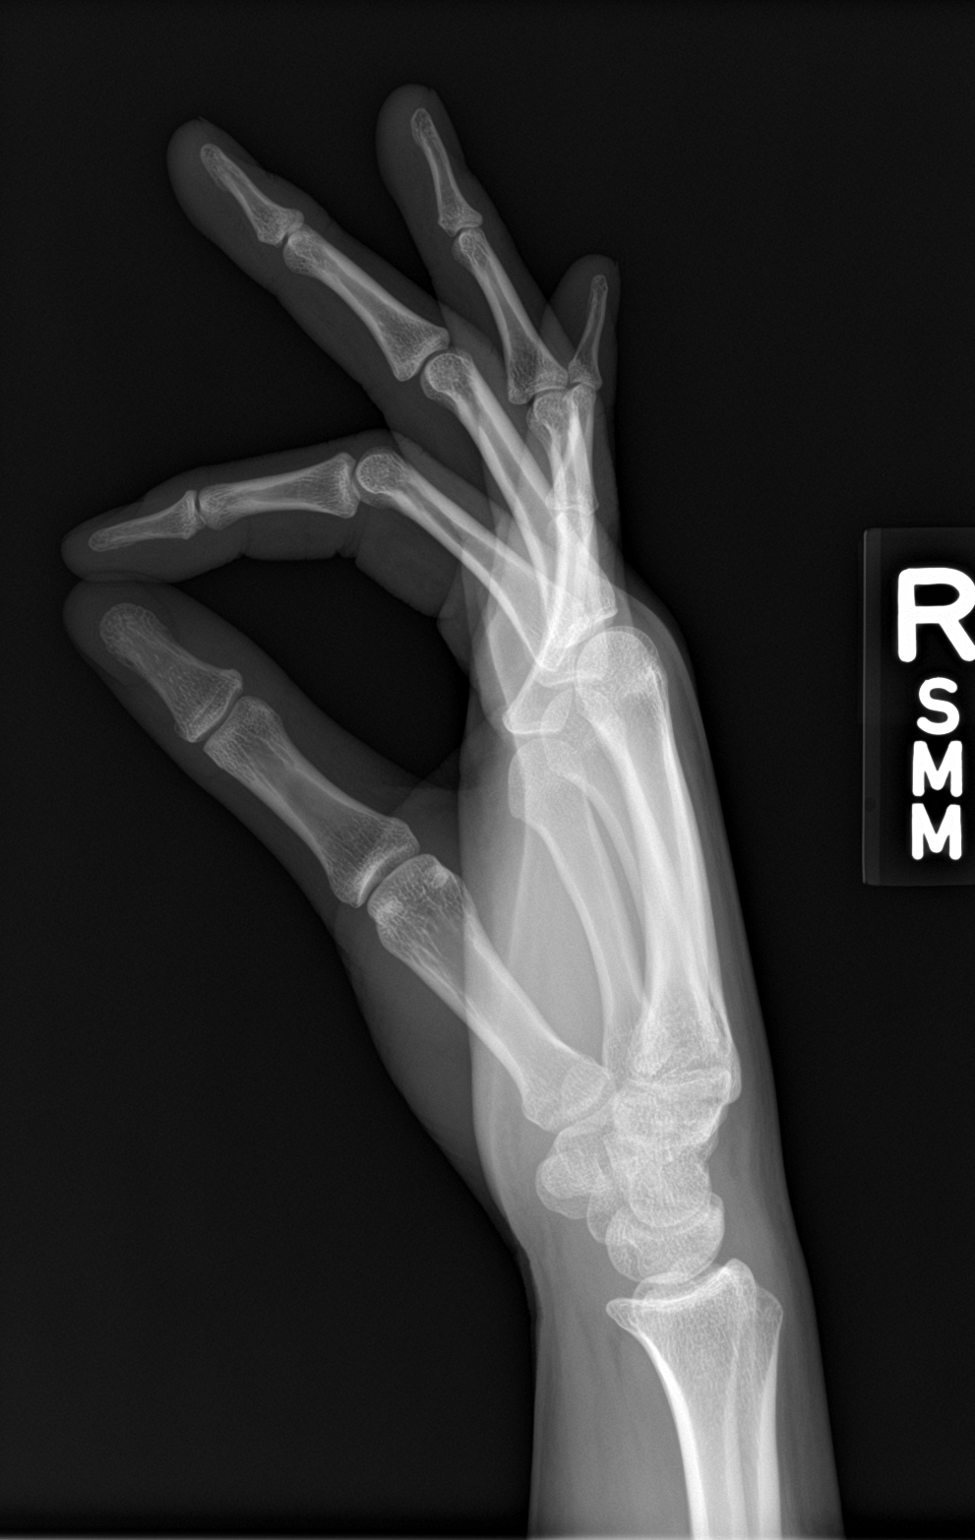

[3 of 3 positions shown; findings below may reference images not displayed]

FINDINGS: There is no evidence of fracture or dislocation. There is no
evidence of arthropathy or other focal bone abnormality. Soft
tissues are unremarkable.
IMPRESSION: Negative.

## 2017-10-20 ENCOUNTER — Ambulatory Visit (HOSPITAL_COMMUNITY): Payer: BC Managed Care – PPO | Admitting: Psychiatry

## 2017-12-20 ENCOUNTER — Encounter (HOSPITAL_COMMUNITY): Payer: Self-pay | Admitting: Emergency Medicine

## 2017-12-20 ENCOUNTER — Other Ambulatory Visit: Payer: Self-pay

## 2017-12-20 ENCOUNTER — Ambulatory Visit (HOSPITAL_COMMUNITY)
Admission: EM | Admit: 2017-12-20 | Discharge: 2017-12-20 | Disposition: A | Discharge status: Home / Self Care | Payer: BC Managed Care – PPO | Attending: Family Medicine | Admitting: Family Medicine

## 2017-12-20 DIAGNOSIS — J029 Acute pharyngitis, unspecified: Secondary | ICD-10-CM | POA: Insufficient documentation

## 2017-12-20 DIAGNOSIS — Z79899 Other long term (current) drug therapy: Secondary | ICD-10-CM | POA: Insufficient documentation

## 2017-12-20 DIAGNOSIS — F331 Major depressive disorder, recurrent, moderate: Secondary | ICD-10-CM | POA: Insufficient documentation

## 2017-12-20 DIAGNOSIS — F902 Attention-deficit hyperactivity disorder, combined type: Secondary | ICD-10-CM | POA: Diagnosis not present

## 2017-12-20 DIAGNOSIS — Z882 Allergy status to sulfonamides status: Secondary | ICD-10-CM | POA: Diagnosis not present

## 2017-12-20 DIAGNOSIS — Z91040 Latex allergy status: Secondary | ICD-10-CM | POA: Diagnosis not present

## 2017-12-20 DIAGNOSIS — B9789 Other viral agents as the cause of diseases classified elsewhere: Secondary | ICD-10-CM | POA: Diagnosis not present

## 2017-12-20 DIAGNOSIS — F17290 Nicotine dependence, other tobacco product, uncomplicated: Secondary | ICD-10-CM | POA: Insufficient documentation

## 2017-12-20 DIAGNOSIS — F411 Generalized anxiety disorder: Secondary | ICD-10-CM | POA: Insufficient documentation

## 2017-12-20 LAB — POCT RAPID STREP A: STREPTOCOCCUS, GROUP A SCREEN (DIRECT): NEGATIVE

## 2017-12-20 NOTE — ED Triage Notes (Signed)
Onset Friday of body aches, headaches, and some sore throat, unknown fever.  Patient is concerned for strep, she says she is prone for strep

## 2017-12-20 NOTE — ED Provider Notes (Signed)
[facility]    CSN: [id number] Arrival date & time: 12/20/17  0815     History   Chief Complaint Chief Complaint  Patient presents with  . Sore Throat    HPI Nancy Barr is a 22 y.o. female.   Sore throat for 4 days.  She has had some headache but has no way to check her temperature.  She denies other symptoms.  She states that she gets strep throat proven by culture or strep test frequently.  HPI  Past Medical History:  Diagnosis Date  . Anxiety   . Borderline personality disorder (HCC)   . Chronic tonsillitis 10/2016  . Depression   . Seasonal asthma    no current med.    Patient Active Problem List   Diagnosis Date Noted  . Suicide attempt by drug ingestion (HCC)   . Attention deficit hyperactivity disorder (ADHD), combined type, severe 04/23/2014  . MDD (major depressive disorder), recurrent episode, moderate (HCC) 09/08/2011  . Generalized anxiety disorder 03/24/2011    Past Surgical History:  Procedure Laterality Date  . HYMENECTOMY  03/09/2010   with drainage of hematocolpos    OB History   None      Home Medications    Prior to Admission medications   Medication Sig Start Date End Date Taking? Authorizing Provider  citalopram (CELEXA) 20 MG tablet Take 1 tablet (20 mg total) by mouth daily. Take 1/2 tablet daily for 1 week then increase to 1 tablet 09/30/17 09/30/18  Eksir, Bo McclintockAlexander Arya, MD    Family History Family History  Adopted: Yes  Family history unknown: Yes    Social History Social History   Tobacco Use  . Smoking status: Current Every Day Smoker    Years: 2.00    Types: E-cigarettes    Start date: 04/09/2013  . Smokeless tobacco: Never Used  Substance Use Topics  . Alcohol use: Yes    Alcohol/week: 0.0 standard drinks    Comment: 1-2 x/week  . Drug use: Yes    Types: Marijuana    Comment: last used 10/07/2016     Allergies   Latex and Sulfa antibiotics   Review of Systems Review of Systems    Constitutional: Negative.   HENT: Positive for sore throat.   Respiratory: Negative.   Cardiovascular: Negative.      Physical Exam Triage Vital Signs ED Triage Vitals  Enc Vitals Group     BP 12/20/17 0828 111/68     Pulse Rate 12/20/17 0828 78     Resp 12/20/17 0828 16     Temp 12/20/17 0828 97.9 F (36.6 C)     Temp Source 12/20/17 0828 Oral     SpO2 12/20/17 0828 100 %     Weight --      Height --      Head Circumference --      Peak Flow --      Pain Score 12/20/17 0836 4     Pain Loc --      Pain Edu? --      Excl. in GC? --    No data found.  Updated Vital Signs BP 111/68 (BP Location: Right Arm)   Pulse 78   Temp 97.9 F (36.6 C) (Oral)   Resp 16   SpO2 100%   Visual Acuity Right Eye Distance:   Left Eye Distance:   Bilateral Distance:    Right Eye Near:   Left Eye Near:    Bilateral Near:  Physical Exam  Constitutional: She appears well-developed and well-nourished.  HENT:  Head: Normocephalic.  Right Ear: Hearing normal.  Mouth/Throat: Oropharynx is clear and moist.  Pulmonary/Chest: Effort normal and breath sounds normal.     UC Treatments / Results  Labs (all labs ordered are listed, but only abnormal results are displayed) Labs Reviewed - No data to display  EKG None  Radiology No results found.  Procedures Procedures (including critical care time)  Medications Ordered in UC Medications - No data to display  Initial Impression / Assessment and Plan / UC Course  I have reviewed the triage vital signs and the nursing notes.  Pertinent labs & imaging results that were available during my care of the patient were reviewed by me and considered in my medical decision making (see chart for details).     Pharyngitis, viral.  Explained differences between viral and strep etiologies.  Will treat symptomatically with lozenges gargles ibuprofen Final Clinical Impressions(s) / UC Diagnoses   Final diagnoses:  None   Discharge  Instructions   None    ED Prescriptions    None     Controlled Substance Prescriptions [place] Controlled Substance Registry consulted? No   Frederica KusterMiller, Tashanda Fuhrer M, MD 12/20/17 [id number]

## 2017-12-23 LAB — CULTURE, GROUP A STREP (THRC)

## 2017-12-26 ENCOUNTER — Telehealth (HOSPITAL_COMMUNITY): Payer: Self-pay

## 2017-12-26 NOTE — Telephone Encounter (Signed)
Culture is positive for non group A Strep germ.  This is a finding of uncertain significance; not the typical 'strep throat' germ.  Pt reports feeling better. 

## 2018-04-04 ENCOUNTER — Ambulatory Visit: Payer: Self-pay | Admitting: Family Medicine

## 2018-04-13 ENCOUNTER — Encounter: Payer: Self-pay | Admitting: Family Medicine

## 2018-04-13 ENCOUNTER — Other Ambulatory Visit: Payer: Self-pay

## 2018-04-13 ENCOUNTER — Ambulatory Visit (INDEPENDENT_AMBULATORY_CARE_PROVIDER_SITE_OTHER): Payer: BC Managed Care – PPO | Admitting: Family Medicine

## 2018-04-13 DIAGNOSIS — J329 Chronic sinusitis, unspecified: Secondary | ICD-10-CM | POA: Diagnosis not present

## 2018-04-13 MED ORDER — AMOXICILLIN-POT CLAVULANATE 875-125 MG PO TABS
1.0000 | ORAL_TABLET | Freq: Two times a day (BID) | ORAL | 0 refills | Status: DC
Start: 2018-04-13 — End: 2022-07-16

## 2018-04-13 NOTE — Assessment & Plan Note (Signed)
Discussed pros and cons of antibiotics and that illness > 7 days was a weak indication.  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

## 2018-04-13 NOTE — Progress Notes (Signed)
Established Patient Office Visit  Subjective:  Patient ID: Nancy Barr, female    DOB: 12/29/1995  Age: 22 y.o. MRN: [id number]  CC:  Chief Complaint  Patient presents with  . Cough    HPI Nancy Barr presents for resp. Illness of 10 days duration.  Began as sore throat.  Now deep cough is most troubling symptom.  Was worsening until 3 days ago.  Now slowly improving.  No documented fever but + subjective fever.  No ear pain.  Some facial pressure/congestion. Hx of recurrent strep throats  Past Medical History:  Diagnosis Date  . Anxiety   . Borderline personality disorder (HCC)   . Chronic tonsillitis 10/2016  . Depression   . Seasonal asthma    no current med.    Past Surgical History:  Procedure Laterality Date  . HYMENECTOMY  03/09/2010   with drainage of hematocolpos    Family History  Adopted: Yes  Family history unknown: Yes    Social History   Socioeconomic History  . Marital status: Single    Spouse name: Not on file  . Number of children: Not on file  . Years of education: Not on file  . Highest education level: Not on file  Occupational History  . Not on file  Social Needs  . Financial resource strain: Not on file  . Food insecurity:    Worry: Not on file    Inability: Not on file  . Transportation needs:    Medical: Not on file    Non-medical: Not on file  Tobacco Use  . Smoking status: Current Every Day Smoker    Years: 2.00    Types: E-cigarettes    Start date: 04/09/2013  . Smokeless tobacco: Never Used  Substance and Sexual Activity  . Alcohol use: Yes    Alcohol/week: 0.0 standard drinks    Comment: 1-2 x/week  . Drug use: Yes    Types: Marijuana    Comment: last used 10/07/2016  . Sexual activity: Yes    Birth control/protection: Implant, Condom    Comment: On Nuvaring  Lifestyle  . Physical activity:    Days per week: Not on file    Minutes per session: Not on file  . Stress: Not on file  Relationships  . Social  connections:    Talks on phone: Not on file    Gets together: Not on file    Attends religious service: Not on file    Active member of club or organization: Not on file    Attends meetings of clubs or organizations: Not on file    Relationship status: Not on file  . Intimate partner violence:    Fear of current or ex partner: Not on file    Emotionally abused: Not on file    Physically abused: Not on file    Forced sexual activity: Not on file  Other Topics Concern  . Not on file  Social History Narrative  . Not on file    Outpatient Medications Prior to Visit  Medication Sig Dispense Refill  . citalopram (CELEXA) 20 MG tablet Take 1 tablet (20 mg total) by mouth daily. Take 1/2 tablet daily for 1 week then increase to 1 tablet 30 tablet 0   No facility-administered medications prior to visit.     Allergies  Allergen Reactions  . Latex Rash  . Sulfa Antibiotics Rash    ROS Review of Systems    Objective:    Physical Exam  BP 100/62   Pulse 80   Temp 98.2 F (36.8 C) (Oral)   Ht 5\' 1"  (1.549 m)   Wt 115 lb 12.8 oz (52.5 kg)   LMP 03/24/2018 (Approximate)   SpO2 99%   BMI 21.88 kg/m  Wt Readings from Last 3 Encounters:  04/13/18 115 lb 12.8 oz (52.5 kg)  10/08/16 108 lb (49 kg)  06/18/16 110 lb 12.8 oz (50.3 kg) (16 %, Z= -0.99)*   * Growth percentiles are based on CDC (Girls, 2-20 Years) data.  TMs normal Throat mild injection without exudate Tender to percussion of sinuses. Neck insignificant lymphadenopathy. Lungs clear Gen non toxic   Health Maintenance Due  Topic Date Due  . HIV Screening  07/17/2011  . CHLAMYDIA SCREENING  04/26/2015  . TETANUS/TDAP  07/17/2015  . PAP SMEAR  07/16/2017  . INFLUENZA VACCINE  12/08/2017    There are no preventive care reminders to display for this patient.  Lab Results  Component Value Date   TSH 2.559 01/17/2013   Lab Results  Component Value Date   WBC 6.3 07/29/2015   HGB 12.6 10/03/2015   HCT  37.0 10/03/2015   MCV 80.8 07/29/2015   PLT 198 07/29/2015   Lab Results  Component Value Date   NA 139 10/03/2015   K 4.3 10/03/2015   CO2 23 07/29/2015   GLUCOSE 99 10/03/2015   BUN 7 10/03/2015   CREATININE 0.60 10/03/2015   BILITOT 0.7 07/29/2015   ALKPHOS 70 07/29/2015   AST 26 07/29/2015   ALT 27 07/29/2015   PROT 7.1 07/29/2015   ALBUMIN 4.2 07/29/2015   CALCIUM 9.7 07/29/2015   ANIONGAP 10 07/29/2015   No results found for: CHOL No results found for: HDL No results found for: LDLCALC No results found for: TRIG No results found for: CHOLHDL No results found for: LKGM0N    Assessment & Plan:   Problem List Items Addressed This Visit    Sinusitis   Relevant Medications   amoxicillin-clavulanate (AUGMENTIN) 875-125 MG tablet      Meds ordered this encounter  Medications  . amoxicillin-clavulanate (AUGMENTIN) 875-125 MG tablet    Sig: Take 1 tablet by mouth 2 (two) times daily.    Dispense:  14 tablet    Refill:  0    Follow-up: No follow-ups on file.    Moses Manners, MD

## 2018-04-13 NOTE — Patient Instructions (Signed)
I am not sure if this is viral or bacterial.  Because you have been sick for over one week, I will treat as a sinus infection. Either way you will get better.

## 2019-01-10 ENCOUNTER — Other Ambulatory Visit: Payer: Self-pay

## 2019-01-10 ENCOUNTER — Ambulatory Visit (INDEPENDENT_AMBULATORY_CARE_PROVIDER_SITE_OTHER): Payer: BC Managed Care – PPO

## 2019-01-10 ENCOUNTER — Encounter (HOSPITAL_COMMUNITY): Payer: Self-pay

## 2019-01-10 ENCOUNTER — Ambulatory Visit (HOSPITAL_COMMUNITY)
Admission: EM | Admit: 2019-01-10 | Discharge: 2019-01-10 | Disposition: A | Discharge status: Home / Self Care | Payer: BC Managed Care – PPO | Attending: Family Medicine | Admitting: Family Medicine

## 2019-01-10 DIAGNOSIS — S161XXA Strain of muscle, fascia and tendon at neck level, initial encounter: Secondary | ICD-10-CM

## 2019-01-10 DIAGNOSIS — S0993XA Unspecified injury of face, initial encounter: Secondary | ICD-10-CM

## 2019-01-10 DIAGNOSIS — S40021A Contusion of right upper arm, initial encounter: Secondary | ICD-10-CM

## 2019-01-10 DIAGNOSIS — S199XXA Unspecified injury of neck, initial encounter: Secondary | ICD-10-CM | POA: Diagnosis not present

## 2019-01-10 MED ORDER — CYCLOBENZAPRINE HCL 5 MG PO TABS: 5.0000 mg | ORAL_TABLET | Freq: Every day | ORAL | 0 refills | Status: DC

## 2019-01-10 MED ORDER — IBUPROFEN 800 MG PO TABS: 800.0000 mg | ORAL_TABLET | Freq: Three times a day (TID) | ORAL | 0 refills | Status: DC

## 2019-01-10 NOTE — Discharge Instructions (Signed)
Neck films are normal today.  Ice application to face, arm and genitals to help with pain.  Ibuprofen to help with pain and swelling.  Muscle relaxer as needed at night to help with neck soreness. May cause drowsiness. Please do not take if driving or drinking alcohol.   I would recommend follow up with oral surgeon and/or ER for further evaluation of the swelling and pain to your left mandible.  Continue to follow up with your primary care provider as needed for any lingering symptoms.
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
## 2019-03-20 ENCOUNTER — Other Ambulatory Visit: Payer: Self-pay

## 2019-03-20 DIAGNOSIS — Z20822 Contact with and (suspected) exposure to covid-19: Secondary | ICD-10-CM

## 2019-03-22 LAB — NOVEL CORONAVIRUS, NAA: SARS-CoV-2, NAA: NOT DETECTED
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
## 2021-10-13 ENCOUNTER — Encounter: Payer: Self-pay | Admitting: *Deleted

## 2022-07-16 ENCOUNTER — Ambulatory Visit (HOSPITAL_COMMUNITY)
Admission: EM | Admit: 2022-07-16 | Discharge: 2022-07-16 | Disposition: A | Discharge status: Home / Self Care | Payer: BC Managed Care – PPO | Attending: Emergency Medicine | Admitting: Emergency Medicine

## 2022-07-16 ENCOUNTER — Encounter (HOSPITAL_COMMUNITY): Payer: Self-pay

## 2022-07-16 DIAGNOSIS — X503XXA Overexertion from repetitive movements, initial encounter: Secondary | ICD-10-CM | POA: Diagnosis not present

## 2022-07-16 DIAGNOSIS — T148XXA Other injury of unspecified body region, initial encounter: Secondary | ICD-10-CM

## 2022-07-16 DIAGNOSIS — M25531 Pain in right wrist: Secondary | ICD-10-CM

## 2022-07-16 MED ORDER — CYCLOBENZAPRINE HCL 10 MG PO TABS: 10.0000 mg | ORAL_TABLET | Freq: Two times a day (BID) | ORAL | 0 refills | Status: AC | PRN

## 2022-07-16 NOTE — ED Triage Notes (Signed)
Chief Complaint: pain in the elbows, right wrist and lower back. 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   Onset: last Friday   Prescriptions or OTC medications tried: No

## 2022-07-16 NOTE — ED Provider Notes (Signed)
[place]    CSN: [id number] Arrival date & time: 07/16/22  1119      History   Chief Complaint Chief Complaint  Patient presents with   Back Pain   Joint Pain    HPI Nancy Barr is a 27 y.o. female.  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  Feels she has overexerted herself. Denies blunt trauma or injury. Has used ibuprofen occasionally  She is here requesting restrictions at work  Past Medical History:  Diagnosis Date   Anxiety    Borderline personality disorder ([place])    Chronic tonsillitis 10/2016   Depression    Seasonal asthma    no current med.    Patient Active Problem List   Diagnosis Date Noted   Sinusitis 04/13/2018   Suicide attempt by drug ingestion [facility])    Attention deficit hyperactivity disorder (ADHD), combined type, severe 04/23/2014   MDD (major depressive disorder), recurrent episode, moderate ([place]) 09/08/2011   Generalized anxiety disorder 03/24/2011    Past Surgical History:  Procedure Laterality Date   HYMENECTOMY  03/09/2010   with drainage of hematocolpos    OB History   No obstetric history on file.      Home Medications    Prior to Admission medications   Medication Sig Start Date End Date Taking? Authorizing Provider  cyclobenzaprine (FLEXERIL) 10 MG tablet Take 1 tablet (10 mg total) by mouth 2 (two) times daily as needed for muscle spasms. 07/16/22  Yes Zarina Pe, Wells Guiles, PA-C    Family History Family History  Adopted: Yes  Problem Relation Age of Onset   Cancer Mother    Healthy Father     Social History Social History   Tobacco Use   Smoking status: Former    Types: E-cigarettes    Start date: 04/09/2013   Smokeless tobacco: Never  Vaping Use   Vaping Use: Every day   Substances: Nicotine, Flavoring  Substance Use Topics   Alcohol use: Yes    Alcohol/week: 0.0  standard drinks of alcohol    Comment: rarely   Drug use: Yes    Types: Marijuana    Comment: last used 10/07/2016     Allergies   Latex and Sulfa antibiotics   Review of Systems Review of Systems As per HPI  Physical Exam Triage Vital Signs ED Triage Vitals  Enc Vitals Group     BP 07/16/22 1206 119/68     Pulse Rate 07/16/22 1206 71     Resp 07/16/22 1206 16     Temp 07/16/22 1206 98.3 F (36.8 C)     Temp Source 07/16/22 1206 Oral     SpO2 07/16/22 1206 97 %     Weight 07/16/22 1205 109 lb (49.4 kg)     Height 07/16/22 1205 5' (1.524 m)     Head Circumference --      Peak Flow --      Pain Score 07/16/22 1203 7     Pain Loc --      Pain Edu? --      Excl. in [place]? --    No data found.  Updated Vital Signs BP 119/68 (BP Location: Left Arm)   Pulse 71   Temp 98.3 F (36.8 C) (Oral)   Resp 16   Ht 5' (1.524 m)   Wt  109 lb (49.4 kg)   LMP 07/09/2022 (Approximate)   SpO2 97%   BMI 21.29 kg/m   Physical Exam Vitals and nursing note reviewed.  Constitutional:      General: She is not in acute distress.    Appearance: Normal appearance.  HENT:     Mouth/Throat:     Mouth: Mucous membranes are moist.     Pharynx: Oropharynx is clear.  Eyes:     Conjunctiva/sclera: Conjunctivae normal.     Pupils: Pupils are equal, round, and reactive to light.  Cardiovascular:     Rate and Rhythm: Normal rate and regular rhythm.     Heart sounds: Normal heart sounds.  Pulmonary:     Effort: Pulmonary effort is normal.     Breath sounds: Normal breath sounds.  Musculoskeletal:        General: No swelling, tenderness, deformity or signs of injury. Normal range of motion.     Cervical back: Normal range of motion. No rigidity.     Comments: Full ROM of neck, back, shoulders, elbows, wrists. No bony tenderness. Strength and sensation intact  Skin:    General: Skin is warm and dry.  Neurological:     General: No focal deficit present.     Mental Status: She is alert and  oriented to person, place, and time.     Motor: No weakness.     UC Treatments / Results  Labs (all labs ordered are listed, but only abnormal results are displayed) Labs Reviewed - No data to display  EKG  Radiology No results found.  Procedures Procedures (including critical care time)  Medications Ordered in UC Medications - No data to display  Initial Impression / Assessment and Plan / UC Course  I have reviewed the triage vital signs and the nursing notes.  Pertinent labs & imaging results that were available during my care of the patient were reviewed by me and considered in my medical decision making (see chart for details).  Recommend symptomatic care with ibuprofen, tylenol, heat or ice. Can try muscle relaxer. Applied wrist brace in clinic for support. Discussed avoiding strenuous activity for at least 2 weeks, especially at the gym. Recommend break from weight lifting.  Discussed urgent care can only provided work note for 3 days. Provided with employee health and wellness contact information to obtain specific work restrictions  Final Clinical Impressions(s) / UC Diagnoses   Final diagnoses:  Muscle strain  Right wrist pain  Overuse injury     Discharge Instructions      Ibuprofen up to 800 mg every 6 hours Tylenol 650 mg every 4 hours as needed Apply ice or hot pad to areas Wear the wrist brace during the day for support Muscle relaxer can be taken twice daily - unless it makes you drowsy then take only at bedtime.  I recommend to avoid heavy lifting and strenuous activity for at least 2 weeks to allow the body to heal  Call the employee health and wellness clinic in regards to work    ED Prescriptions     Medication [place]. Provider   cyclobenzaprine (FLEXERIL) 10 MG tablet Take 1 tablet (10 mg total) by mouth 2 (two) times daily as needed for muscle spasms. 20 tablet Leontine Radman, Wells Guiles, PA-C      PDMP not reviewed this encounter.    Onalee Steinbach, Wells Guiles, [place] 07/16/22 1326

## 2022-07-16 NOTE — Discharge Instructions (Addendum)
Ibuprofen up to 800 mg every 6 hours Tylenol 650 mg every 4 hours as needed Apply ice or hot pad to areas Wear the wrist brace during the day for support Muscle relaxer can be taken twice daily - unless it makes you drowsy then take only at bedtime.  I recommend to avoid heavy lifting and strenuous activity for at least 2 weeks to allow the body to heal  Call the employee health and wellness clinic in regards to work
